# Patient Record
Sex: Female | Born: 1960 | Hispanic: Yes | Marital: Married
Health system: Southern US, Community
[De-identification: ages and names within clinical notes are randomized; demographics above are authoritative.]

---

## 2011-10-23 ENCOUNTER — Observation Stay: Payer: Self-pay | Admitting: Student

## 2011-10-23 LAB — COMPREHENSIVE METABOLIC PANEL
Albumin: 3.6 g/dL (ref 3.4–5.0)
Alkaline Phosphatase: 42 U/L — ABNORMAL LOW (ref 50–136)
Anion Gap: 12 (ref 7–16)
Calcium, Total: 8.4 mg/dL — ABNORMAL LOW (ref 8.5–10.1)
Chloride: 104 mmol/L (ref 98–107)
Glucose: 114 mg/dL — ABNORMAL HIGH (ref 65–99)
Osmolality: 285 (ref 275–301)
SGOT(AST): 23 U/L (ref 15–37)
SGPT (ALT): 24 U/L
Sodium: 143 mmol/L (ref 136–145)

## 2011-10-23 LAB — CBC
HCT: 38.4 % (ref 35.0–47.0)
HGB: 13.2 g/dL (ref 12.0–16.0)
MCHC: 34.4 g/dL (ref 32.0–36.0)
MCV: 94 fL (ref 80–100)
RDW: 12.8 % (ref 11.5–14.5)

## 2011-10-23 LAB — TROPONIN I
Troponin-I: <0.02 ng/mL
Troponin-I: <0.02 ng/mL
Troponin-I: <0.02 ng/mL

## 2011-10-23 LAB — CK-MB: CK-MB: 0.6 ng/mL (ref 0.5–3.6)

## 2011-10-23 IMAGING — CR DG CHEST 2V
1 series · 2 of 2 positions shown · non-contrast
Comparison: none

REASON FOR EXAM: chest pain
COMMENTS:

PROCEDURE:     DXR - DXR CHEST PA (OR AP) AND LATERAL  - [DATE]  [DATE]
RESULT:     Comparison: None

[Series 1: pa · 0.17mm/px · 2 of 2 slices shown]
[im 1/2]
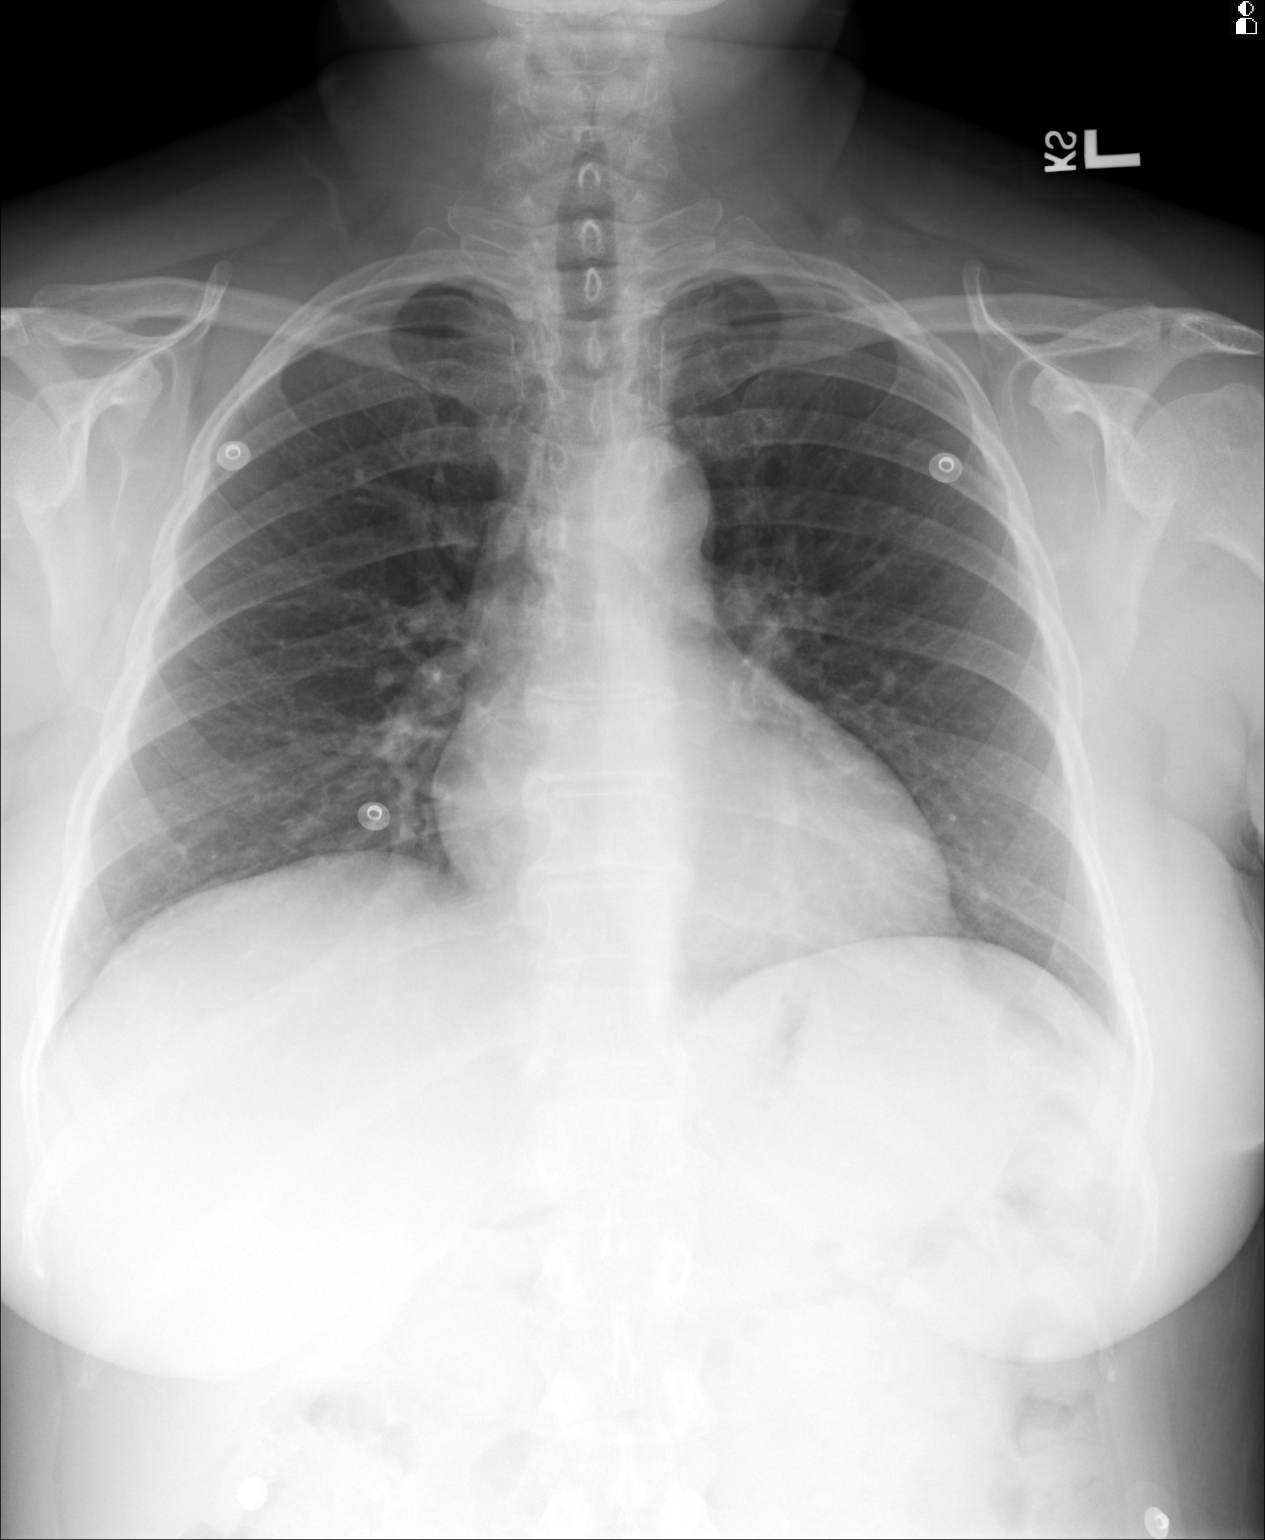
[im 2/2]
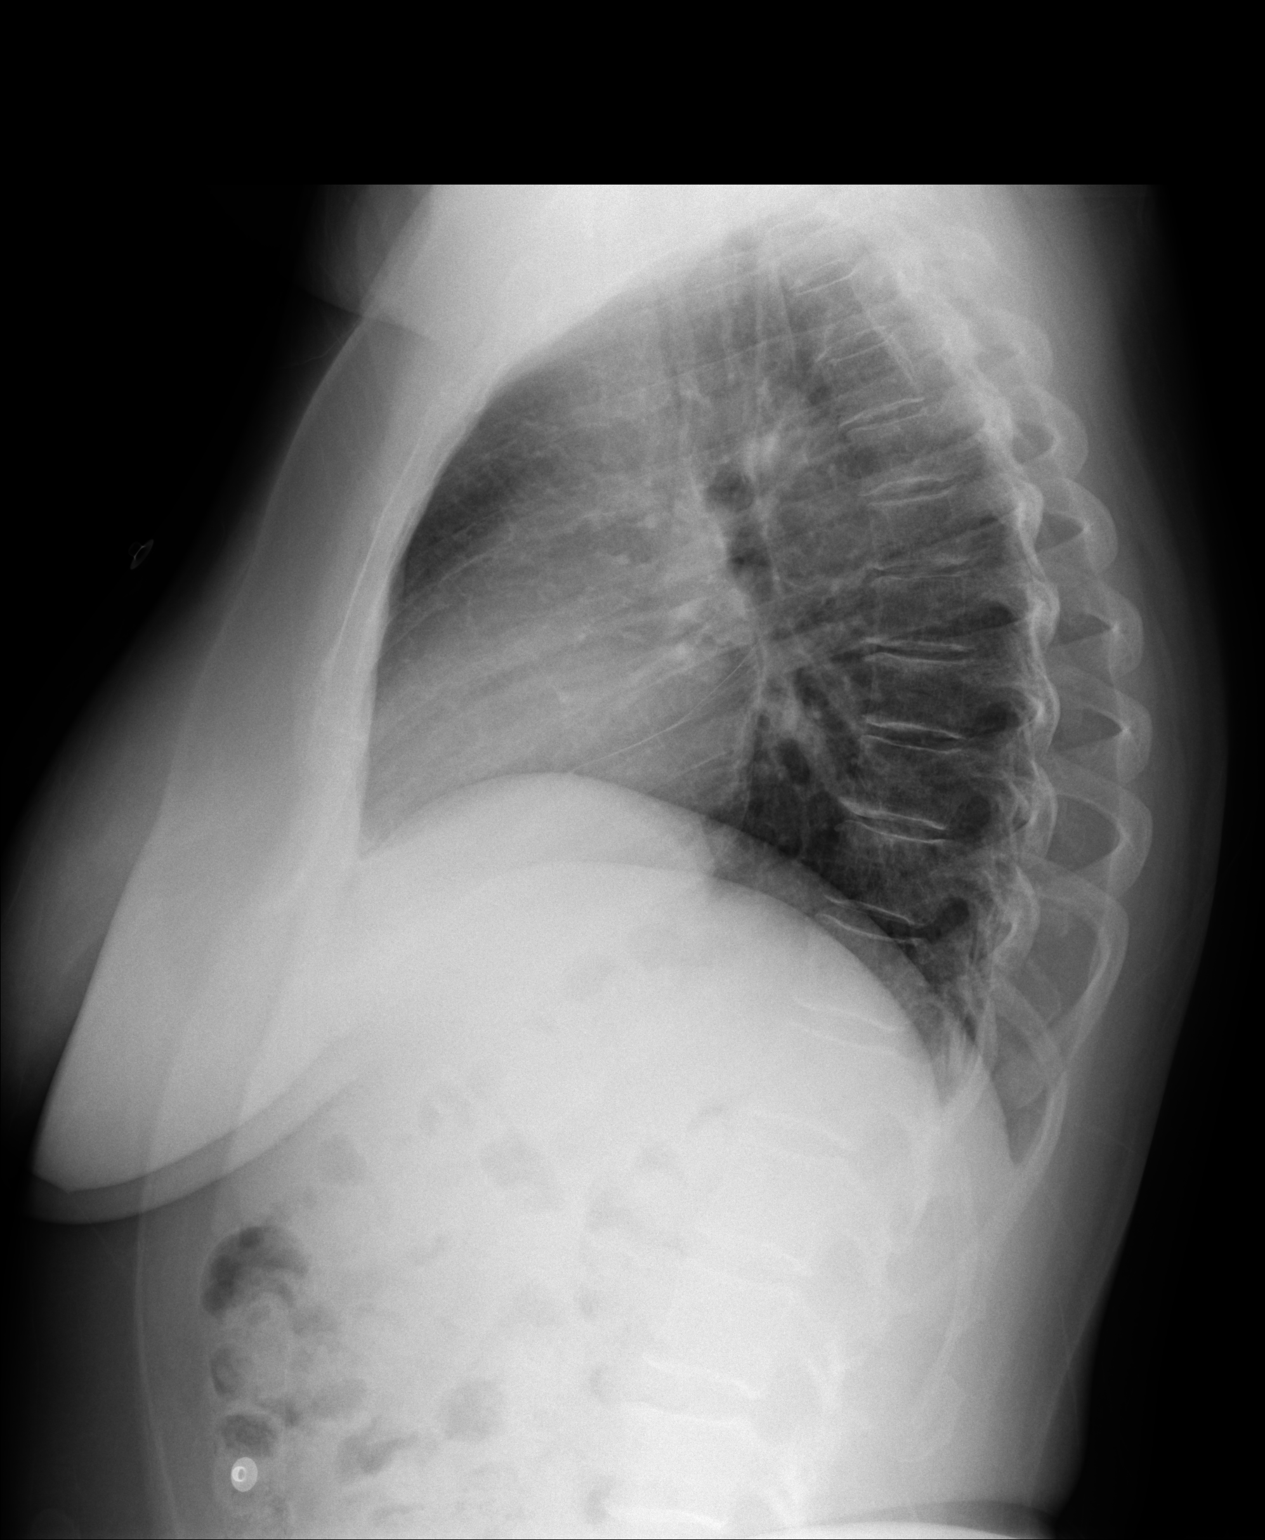

[2 of 2 positions shown; findings below may reference images not displayed]

FINDINGS: PA and lateral chest radiographs are provided.  There is no focal
parenchymal opacity, pleural effusion, or pneumothorax. The heart and
mediastinum are unremarkable.  The osseous structures are unremarkable.
IMPRESSION: No acute disease of the che[REDACTED]

## 2011-10-24 LAB — LIPID PANEL
Cholesterol: 220 mg/dL — ABNORMAL HIGH (ref 0–200)
HDL Cholesterol: 45 mg/dL (ref 40–60)
VLDL Cholesterol, Calc: 33 mg/dL (ref 5–40)

## 2011-10-24 LAB — HEMOGLOBIN A1C: Hemoglobin A1C: 6 % (ref 4.2–6.3)

## 2011-10-24 LAB — CK-MB: CK-MB: <0.5 ng/mL — ABNORMAL LOW (ref 0.5–3.6)

## 2014-11-30 NOTE — Discharge Summary (Signed)
PATIENT NAME:  Sylvia Hart, [name] MR#:  [id number] DATE OF BIRTH:  1961-05-28  DATE OF ADMISSION:  10/23/2011 DATE OF DISCHARGE:  10/24/2011  CHIEF COMPLAINT: Shortness of breath.   DISCHARGE DIAGNOSES: 1. Chest pain, likely stress related and noncardiac. 2. Syncope, likely in the setting of chest pain. 3. Obesity. 4. Hyperlipidemia.   DISCHARGE MEDICATIONS: Aspirin 81 mg daily. Omeprazole 20 mg daily for three weeks. Metoprolol 12.5 mg p.o. b.i.d. Simvastatin 10 mg p.o. daily.   DIET: Low sodium, ADA diet.   ACTIVITY: As tolerated.   FOLLOWUP: Please for follow-up with your PCP within 1 to 2 weeks.   CHIEF COMPLAINT: The patient is a 54 year old Hispanic female who presents with chest pain. For full details, please see the history and physical dictated by Dr. Dema SeverinMungal on 10/23/2011. This patient did have some risk factors and had some typical features and possible syncope. She was admitted to the hospitalist service for observation and ruling out acute coronary syndrome.  SIGNIFICANT LABS/IMAGING: Creatinine on arrival 0.72, sodium 143, potassium 3.5. Troponins negative x3. LFTs: Alkaline phosphatase 42, otherwise within normal limits. CK-MB negative x3. CBC on arrival within normal limits. Echocardiogram done showing normal ejection fraction, mild MR and TR. Stress test, nuclear medicine showing normal LV function, normal wall motion. No evidence of a scar or ischemia. X-ray of the chest, PA and lateral, showing no acute disease of the chest.   HOSPITAL COURSE: The patient was admitted to the hospitalist service. Given history of MI in both parents and some typical features, she was ruled out for acute coronary syndrome. She was started on low-dose aspirin. She was also started on simvastatin, as her LDL is above goal. She underwent a stress test which was negative. She has no further chest pains. The patient has been having increased stress related to her job. This possibly  contributed to the chest pain. She has no shortness of breath or dizziness. She had an echocardiogram as well which showed normal ejection fraction, no significant valvular disease, and had no significant arrhythmias on the telemetry. The syncope was likely in the setting of the chest pain.  At this point, given that she has no further chest pains, she will be discharged with outpatient follow-up with her primary care physician.  CODE STATUS: THE PATIENT IS FULL CODE.  TIME: Total time spent, 35 minutes.    ____________________________ Krystal EatonShayiq Leler Brion, MD sa:kma D: 10/24/2011 17:26:19 ET T: 10/25/2011 12:39:34 ET JOB#: [id number]  cc: Krystal EatonShayiq Liya Strollo, MD, <Dictator> Krystal EatonSHAYIQ Khaleesi Gruel MD ELECTRONICALLY SIGNED 10/28/2011 18:42

## 2014-11-30 NOTE — H&P (Signed)
PATIENT NAME:  Sylvia Hart, GUNN MR#:  [id number] DATE OF BIRTH:  [birth year]  DATE OF ADMISSION:  10/23/2011  ED REFERRING PHYSICIAN: Dana Allan, MD  PRIMARY CARE PHYSICIAN: None.  INDICATION FOR ADMISSION: Chest pain.   HISTORY OF PRESENT ILLNESS: This is a 54 year old female with past medical history significant for obesity and previous admission 10 years ago for chest pain, who also had a negative workup, presenting again with chest pain. History is per the patient son.  The patient states for the past 2 to 3 months she has been having chest pain intermittently occurring at nonspecific times and with nonspecific activity, occurring at rest or even with activity. Initially the chest pain, for the past 2 to 3 months, has just been kind of dull to sharp, may be 3 to 4 out of 10. The patient never thought about getting it further evaluated; however, this morning, at about 1 o'clock  a.m., she had substernal pressure like chest pain that made her break out in a sweat and become short of breath. Pain was rated as an 8 out of 10 radiating towards the left shoulder and associated with nausea and dizziness and diaphoresis. Her friend who brought her in the car stated that the patient continued to complain of chest pain and she would have a syncopal episode x2 while they were in the car that was short-lived and for only a few minutes. On arrival to the ED, the patient was given fluids and started on aspirin. Currently her chest pain is down to about a 4 out of 10. Hospitalist services were consulted for further inpatient workup and management of the patient.   PAST MEDICAL HISTORY:  1. Obesity. 2. Hospitalization 10 years ago for chest pain with a negative workup.  MEDICATIONS: Tylenol as needed at home.   DRUG ALLERGIES: No known drug allergies.   PAST SURGICAL HISTORY:  1. Hysterectomy.  2. Cesarean section.  LAST HOSPITALIZATION: 10 years ago at [facility] for  chest pain with negative workup.   FAMILY HISTORY: Father with pacemaker and history of angina. Mother had a MI at the age of 18.   SOCIAL HISTORY: Nondrinker, nonsmoker. Works in a Psychiatric nurse.   REVIEW OF SYSTEMS: CONSTITUTIONAL: No fever, fatigue, weakness. Chronic chest pain over the last 2 to 3 months. No weight loss or weight gain. EYES: No blurred vision, double vision, pain, redness. ENT: No tinnitus, ear pain, hearing loss, or allergies. RESPIRATORY: No cough, wheezing, hemoptysis. Positive dyspnea. No asthma. CARDIOVASCULAR: Positive chest pain. Positive dyspnea. Positive diaphoresis. No high blood pressure. Positive syncope. GI: Positive nausea. No abdominal pain, vomiting, or diarrhea. No hematemesis. GASTROINTESTINAL: No history of hematuria. ENDOCRINE: No polyuria, nocturia, or thyroid problems. HEME/LYMPH: No anemia, easy bruising, bleeding, or swollen glands. MUSCULOSKELETAL: No pain in the neck, back, shoulders, knees, or hips. No arthritis. NEURO: No numbness, weakness, dysarthria, epilepsy, tremor or vertigo. PSYCH: No insomnia, ADD, OCD, bipolar or depression.   PHYSICAL EXAMINATION:  VITALS: Currently temperature is 98, blood pressure 114/64, pulse 66, and respirations 17.  GENERAL APPEARANCE: Obese, well-developed female lying in bed, in no acute respiratory distress.   HEENT: Pupils are equally round and reactive to light and accommodation. Extraocular movements are intact. No scleral icterus. No difficulty hearing.   NECK: No thyroid enlargement, nodules, or tenderness. Neck is supple and nontender.   RESPIRATORY: Clear to auscultation bilaterally. No rales, rhonchi, crackles, or diminished breath sounds.   CARDIOVASCULAR: Regular rate and rhythm. No  S3 or S4. No PMI lateralization. Good pedal pulses. S1 and S2 auscultated.   ABDOMEN: Soft, nontender, and nondistended. Positive bowel sounds. Obese abdomen.  MUSCULOSKELETAL: 5/5 strength in bilateral upper and  lower extremities.   SKIN: No rash, lesions, erythema, nodules, or induration.  LYMPH: No adenopathy in the cervical, axillary, or supraclavicular regions.   NEURO: Cranial nerves II through XII grossly intact. Memory is intact. Sensory is intact.   PSYCH: Alert and oriented to time and place. Good judgment.  LABS/STUDIES: Sodium 142, potassium 3.5, chloride 104, bicarbonate 27, BUN 11, creatinine 0.72, glucose 114. Troponin less than 0.02. Hemoglobin 13.2, hematocrit 38.4, platelet count 285, white cell count 8.0.   EKG: Normal sinus rhythm, rate of about 80. No acute ST-T wave changes.   Chest x-ray is currently pending.   ASSESSMENT AND PLAN: A 54 year old female with no significant past medical history admitted for chest pain. 1. Chest pain, unstable angina versus acute coronary syndrome: Continue with medical management with aspirin, beta blocker, statin, nitro sublingual, p.r.n. morphine and p.r.n. oxygen. We will set up the patient for a nuclear stress test after cardiac enzymes x3 is negative. Continue with good blood pressure control. Admit the patient to telemetry, observation bed.  2. Obesity: The patient is counseled on diet and exercise after discharge. 3. Syncope: Most likely related to chest pain, however, we will go ahead and check orthostatic vitals. 4. Diaphoresis: Again secondary to chest pain. Management as per above.  5. GI and DVT prophylaxis: Continue with PPI and heparin.         CODE STATUS: FULL CODE.   TIME SPENT DICTATING AND EVALUATING PATIENT: 40 minutes. ____________________________ Stephanie AcreVishal Michaelyn Wall, MD vm:slb D: 10/23/2011 06:15:28 ET T: 10/23/2011 10:52:12 ET JOB#: [id number]  cc: Stephanie AcreVishal Anaysia Germer, MD, <Dictator> Stephanie AcreVISHAL Tameeka Luo MD ELECTRONICALLY SIGNED 10/23/2011 22:17

## 2020-02-21 ENCOUNTER — Other Ambulatory Visit: Payer: Self-pay | Admitting: Physician Assistant

## 2020-02-21 DIAGNOSIS — J384 Edema of larynx: Secondary | ICD-10-CM
